# Patient Record
Sex: Female | Born: 1980 | Race: White | Hispanic: No | Marital: Single | Smoking: Current every day smoker
Health system: Southern US, Community
[De-identification: ages and names within clinical notes are randomized; demographics above are authoritative.]

## PROBLEM LIST (undated history)

## (undated) DIAGNOSIS — J45909 Unspecified asthma, uncomplicated: Secondary | ICD-10-CM

## (undated) DIAGNOSIS — T8859XA Other complications of anesthesia, initial encounter: Secondary | ICD-10-CM

## (undated) DIAGNOSIS — T4145XA Adverse effect of unspecified anesthetic, initial encounter: Secondary | ICD-10-CM

## (undated) DIAGNOSIS — M1990 Unspecified osteoarthritis, unspecified site: Secondary | ICD-10-CM

## (undated) HISTORY — PX: MOUTH SURGERY: SHX715

---

## 2002-01-08 ENCOUNTER — Encounter: Payer: Self-pay | Admitting: Obstetrics and Gynecology

## 2002-01-08 ENCOUNTER — Inpatient Hospital Stay (HOSPITAL_COMMUNITY): Admission: AD | Admit: 2002-01-08 | Discharge: 2002-01-08 | Payer: Self-pay | Admitting: Obstetrics and Gynecology

## 2015-06-29 ENCOUNTER — Emergency Department (HOSPITAL_COMMUNITY): Payer: Managed Care, Other (non HMO)

## 2015-06-29 ENCOUNTER — Emergency Department (HOSPITAL_COMMUNITY)
Admission: EM | Admit: 2015-06-29 | Discharge: 2015-06-29 | Disposition: A | Discharge status: Home / Self Care | Payer: Managed Care, Other (non HMO) | Attending: Emergency Medicine | Admitting: Emergency Medicine

## 2015-06-29 ENCOUNTER — Encounter (HOSPITAL_COMMUNITY): Payer: Self-pay | Admitting: Emergency Medicine

## 2015-06-29 DIAGNOSIS — T148XXA Other injury of unspecified body region, initial encounter: Secondary | ICD-10-CM

## 2015-06-29 DIAGNOSIS — W000XXA Fall on same level due to ice and snow, initial encounter: Secondary | ICD-10-CM | POA: Diagnosis not present

## 2015-06-29 DIAGNOSIS — Z880 Allergy status to penicillin: Secondary | ICD-10-CM | POA: Diagnosis not present

## 2015-06-29 DIAGNOSIS — S82891A Other fracture of right lower leg, initial encounter for closed fracture: Secondary | ICD-10-CM

## 2015-06-29 DIAGNOSIS — S82851A Displaced trimalleolar fracture of right lower leg, initial encounter for closed fracture: Secondary | ICD-10-CM | POA: Diagnosis not present

## 2015-06-29 DIAGNOSIS — W009XXA Unspecified fall due to ice and snow, initial encounter: Secondary | ICD-10-CM

## 2015-06-29 DIAGNOSIS — R000 Tachycardia, unspecified: Secondary | ICD-10-CM | POA: Insufficient documentation

## 2015-06-29 DIAGNOSIS — Y9389 Activity, other specified: Secondary | ICD-10-CM | POA: Diagnosis not present

## 2015-06-29 DIAGNOSIS — S99911A Unspecified injury of right ankle, initial encounter: Secondary | ICD-10-CM | POA: Diagnosis present

## 2015-06-29 DIAGNOSIS — Y9289 Other specified places as the place of occurrence of the external cause: Secondary | ICD-10-CM | POA: Insufficient documentation

## 2015-06-29 DIAGNOSIS — F17210 Nicotine dependence, cigarettes, uncomplicated: Secondary | ICD-10-CM | POA: Insufficient documentation

## 2015-06-29 DIAGNOSIS — Y998 Other external cause status: Secondary | ICD-10-CM | POA: Diagnosis not present

## 2015-06-29 MED ORDER — PROPOFOL 10 MG/ML IV BOLUS
INTRAVENOUS | Status: AC | PRN
  Administered 2015-06-29: 32 mg via INTRAVENOUS

## 2015-06-29 MED ORDER — PROPOFOL 10 MG/ML IV BOLUS: 0.5000 mg/kg | INTRAVENOUS | Status: DC | PRN

## 2015-06-29 MED ORDER — SODIUM CHLORIDE 0.9 % IV SOLN
INTRAVENOUS | Status: AC | PRN
Start: 2015-06-29 — End: 2015-06-29
  Administered 2015-06-29: 1000 mL via INTRAVENOUS

## 2015-06-29 MED ORDER — MORPHINE SULFATE (PF) 4 MG/ML IV SOLN
8.0000 mg | Freq: Once | INTRAVENOUS | Status: AC
  Administered 2015-06-29: 8 mg via INTRAVENOUS
  Filled 2015-06-29: qty 2

## 2015-06-29 MED ORDER — HYDROMORPHONE HCL 1 MG/ML IJ SOLN
1.0000 mg | Freq: Once | INTRAMUSCULAR | Status: AC
  Administered 2015-06-29: 1 mg via INTRAVENOUS
  Filled 2015-06-29: qty 1

## 2015-06-29 MED ORDER — PROPOFOL 10 MG/ML IV BOLUS
INTRAVENOUS | Status: AC
  Filled 2015-06-29: qty 20

## 2015-06-29 MED ORDER — OXYCODONE-ACETAMINOPHEN 5-325 MG PO TABS: 1.0000 | ORAL_TABLET | ORAL | Status: AC | PRN

## 2015-06-29 NOTE — Progress Notes (Signed)
Orthopedic Tech Progress Note Patient Details:  Aviva SignsKendra Sisley 10/17/1980 [id number]  Ortho Devices Type of Ortho Device: Post (short leg) splint, Stirrup splint Ortho Device/Splint Location: rle Ortho Device/Splint Interventions: Ordered, Application   [name], Mariano Doshi J 06/29/2015, 9:32 PM

## 2015-06-29 NOTE — Discharge Instructions (Signed)
Cast or Splint Care °Casts and splints support injured limbs and keep bones from moving while they heal. It is important to care for your cast or splint at home.   °HOME CARE INSTRUCTIONS °· Keep the cast or splint uncovered during the drying period. It can take 24 to 48 hours to dry if it is made of plaster. A fiberglass cast will dry in less than 1 hour. °· Do not rest the cast on anything harder than a pillow for the first 24 hours. °· Do not put weight on your injured limb or apply pressure to the cast until your health care provider gives you permission. °· Keep the cast or splint dry. Wet casts or splints can lose their shape and may not support the limb as well. A wet cast that has lost its shape can also create harmful pressure on your skin when it dries. Also, wet skin can become infected. °· Cover the cast or splint with a plastic bag when bathing or when out in the rain or snow. If the cast is on the trunk of the body, take sponge baths until the cast is removed. °· If your cast does become wet, dry it with a towel or a blow dryer on the cool setting only. °· Keep your cast or splint clean. Soiled casts may be wiped with a moistened cloth. °· Do not place any hard or soft foreign objects under your cast or splint, such as cotton, toilet paper, lotion, or powder. °· Do not try to scratch the skin under the cast with any object. The object could get stuck inside the cast. Also, scratching could lead to an infection. If itching is a problem, use a blow dryer on a cool setting to relieve discomfort. °· Do not trim or cut your cast or remove padding from inside of it. °· Exercise all joints next to the injury that are not immobilized by the cast or splint. For example, if you have a long leg cast, exercise the hip joint and toes. If you have an arm cast or splint, exercise the shoulder, elbow, thumb, and fingers. °· Elevate your injured arm or leg on 1 or 2 pillows for the first 1 to 3 days to decrease  swelling and pain. It is best if you can comfortably elevate your cast so it is higher than your heart. °SEEK MEDICAL CARE IF:  °· Your cast or splint cracks. °· Your cast or splint is too tight or too loose. °· You have unbearable itching inside the cast. °· Your cast becomes wet or develops a soft spot or area. °· You have a bad smell coming from inside your cast. °· You get an object stuck under your cast. °· Your skin around the cast becomes red or raw. °· You have new pain or worsening pain after the cast has been applied. °SEEK IMMEDIATE MEDICAL CARE IF:  °· You have fluid leaking through the cast. °· You are unable to move your fingers or toes. °· You have discolored (blue or white), cool, painful, or very swollen fingers or toes beyond the cast. °· You have tingling or numbness around the injured area. °· You have severe pain or pressure under the cast. °· You have any difficulty with your breathing or have shortness of breath. °· You have chest pain. °  °This information is not intended to replace advice given to you by your health care provider. Make sure you discuss any questions you have with your health care   provider.   Document Released: 06/03/2000 Document Revised: 03/27/2013 Document Reviewed: 12/13/2012 Elsevier Interactive Patient Education 2016 Else[employer] Inc[employer] Ankle Fracture A fracture is a break in a bone. The ankle joint is made up of three bones. These include the lower (distal)sections of your lower leg bones, called the tibia and fibula, along with a bone in your foot, called the talus. Depending on how bad the break is and if more than one ankle joint bone is broken, a cast or splint is used to protect and keep your injured bone from moving while it heals. Sometimes, surgery is required to help the fracture heal properly.  There are two general types of fractures:  Stable fracture. This includes a single fracture line through one bone, with no injury to ankle ligaments. A fracture of  the talus that does not have any displacement (movement of the bone on either side of the fracture line) is also stable.  Unstable fracture. This includes more than one fracture line through one or more bones in the ankle joint. It also includes fractures that have displacement of the bone on either side of the fracture line. CAUSES  A direct blow to the ankle.   Quickly and severely twisting your ankle.  Trauma, such as a car accident or falling from a significant height. RISK FACTORS You may be at a higher risk of ankle fracture if:  You have certain medical conditions.  You are involved in high-impact sports.  You are involved in a high-impact car accident. SIGNS AND SYMPTOMS   Tender and swollen ankle.  Bruising around the injured ankle.  Pain on movement of the ankle.  Difficulty walking or putting weight on the ankle.  A cold foot below the site of the ankle injury. This can occur if the blood vessels passing through your injured ankle were also damaged.  Numbness in the foot below the site of the ankle injury. DIAGNOSIS  An ankle fracture is usually diagnosed with a physical exam and X-rays. A CT scan may also be required for complex fractures. TREATMENT  Stable fractures are treated with a cast or splint and using crutches to avoid putting weight on your injured ankle. This is followed by an ankle strengthening program. Some patients require a special type of cast, depending on other medical problems they may have. Unstable fractures require surgery to ensure the bones heal properly. Your health care provider will tell you what type of fracture you have and the best treatment for your condition. HOME CARE INSTRUCTIONS   Review correct crutch use with your health care provider and use your crutches as directed. Safe use of crutches is extremely important. Misuse of crutches can cause you to fall or cause injury to nerves in your hands or armpits.  Do not put weight or  pressure on the injured ankle until directed by your health care provider.  To lessen the swelling, keep the injured leg elevated while sitting or lying down.  Apply ice to the injured area:  Put ice in a plastic bag.  Place a towel between your cast and the bag.  Leave the ice on for 20 minutes, 2-3 times a day.  If you have a plaster or fiberglass cast:  Do not try to scratch the skin under the cast with any objects. This can increase your risk of skin infection.  Check the skin around the cast every day. You may put lotion on any red or sore areas.  Keep your cast dry and  clean.  If you have a plaster splint:  Wear the splint as directed.  You may loosen the elastic around the splint if your toes become numb, tingle, or turn cold or blue.  Do not put pressure on any part of your cast or splint; it may break. Rest your cast only on a pillow the first 24 hours until it is fully hardened.  Your cast or splint can be protected during bathing with a plastic bag sealed to your skin with medical tape. Do not lower the cast or splint into water.  Take medicines as directed by your health care provider. Only take over-the-counter or prescription medicines for pain, discomfort, or fever as directed by your health care provider.  Do not drive a vehicle until your health care provider specifically tells you it is safe to do so.  If your health care provider has given you a follow-up appointment, it is very important to keep that appointment. Not keeping the appointment could result in a chronic or permanent injury, pain, and disability. If you have any problem keeping the appointment, call the facility for assistance. SEEK MEDICAL CARE IF: You develop increased swelling or discomfort. SEEK IMMEDIATE MEDICAL CARE IF:   Your cast gets damaged or breaks.  You have continued severe pain.  You develop new pain or swelling after the cast was put on.  Your skin or toenails below the  injury turn blue or gray.  Your skin or toenails below the injury feel cold, numb, or have loss of sensitivity to touch.  There is a bad smell or pus draining from under the cast. MAKE SURE YOU:   Understand these instructions.  Will watch your condition.  Will get help right away if you are not doing well or get worse.   This information is not intended to replace advice given to you by your health care provider. Make sure you discuss any questions you have with your health care provider.   Document Released: 06/03/2000 Document Revised: 06/11/2013 Document Reviewed: 01/03/2013 Elsevier Interactive Patient Education 2016 Elsevier Inc. Cryotherapy Cryotherapy means treatment with cold. Ice or gel packs can be used to reduce both pain and swelling. Ice is the most helpful within the first 24 to 48 hours after an injury or flare-up from overusing a muscle or joint. Sprains, strains, spasms, burning pain, shooting pain, and aches can all be eased with ice. Ice can also be used when recovering from surgery. Ice is effective, has very few side effects, and is safe for most people to use. PRECAUTIONS  Ice is not a safe treatment option for people with:  Raynaud phenomenon. This is a condition affecting small blood vessels in the extremities. Exposure to cold may cause your problems to return.  Cold hypersensitivity. There are many forms of cold hypersensitivity, including:  Cold urticaria. Red, itchy hives appear on the skin when the tissues begin to warm after being iced.  Cold erythema. This is a red, itchy rash caused by exposure to cold.  Cold hemoglobinuria. Red blood cells break down when the tissues begin to warm after being iced. The hemoglobin that carry oxygen are passed into the urine because they cannot combine with blood proteins fast enough.  Numbness or altered sensitivity in the area being iced. If you have any of the following conditions, do not use ice until you have  discussed cryotherapy with your caregiver:  Heart conditions, such as arrhythmia, angina, or chronic heart disease.  High blood pressure.  Healing  wounds or open skin in the area being iced.  Current infections.  Rheumatoid arthritis.  Poor circulation.  Diabetes. Ice slows the blood flow in the region it is applied. This is beneficial when trying to stop inflamed tissues from spreading irritating chemicals to surrounding tissues. However, if you expose your skin to cold temperatures for too long or without the proper protection, you can damage your skin or nerves. Watch for signs of skin damage due to cold. HOME CARE INSTRUCTIONS Follow these tips to use ice and cold packs safely.  Place a dry or damp towel between the ice and skin. A damp towel will cool the skin more quickly, so you may need to shorten the time that the ice is used.  For a more rapid response, add gentle compression to the ice.  Ice for no more than 10 to 20 minutes at a time. The bonier the area you are icing, the less time it will take to get the benefits of ice.  Check your skin after 5 minutes to make sure there are no signs of a poor response to cold or skin damage.  Rest 20 minutes or more between uses.  Once your skin is numb, you can end your treatment. You can test numbness by very lightly touching your skin. The touch should be so light that you do not see the skin dimple from the pressure of your fingertip. When using ice, most people will feel these normal sensations in this order: cold, burning, aching, and numbness.  Do not use ice on someone who cannot communicate their responses to pain, such as small children or people with dementia. HOW TO MAKE AN ICE PACK Ice packs are the most common way to use ice therapy. Other methods include ice massage, ice baths, and cryosprays. Muscle creams that cause a cold, tingly feeling do not offer the same benefits that ice offers and should not be used as a  substitute unless recommended by your caregiver. To make an ice pack, do one of the following:  Place crushed ice or a bag of frozen vegetables in a sealable plastic bag. Squeeze out the excess air. Place this bag inside another plastic bag. Slide the bag into a pillowcase or place a damp towel between your skin and the bag.  Mix 3 parts water with 1 part rubbing alcohol. Freeze the mixture in a sealable plastic bag. When you remove the mixture from the freezer, it will be slushy. Squeeze out the excess air. Place this bag inside another plastic bag. Slide the bag into a pillowcase or place a damp towel between your skin and the bag. SEEK MEDICAL CARE IF:  You develop white spots on your skin. This may give the skin a blotchy (mottled) appearance.  Your skin turns blue or pale.  Your skin becomes waxy or hard.  Your swelling gets worse. MAKE SURE YOU:   Understand these instructions.  Will watch your condition.  Will get help right away if you are not doing well or get worse.   This information is not intended to replace advice given to you by your health care provider. Make sure you discuss any questions you have with your health care provider.   Document Released: 01/31/2011 Document Revised: 06/27/2014 Document Reviewed: 01/31/2011 Elsevier Interactive Patient Education [employer]2016 Elsevier Inc.

## 2015-06-29 NOTE — ED Provider Notes (Signed)
Patient signed out of end of shift by [facility], PA-C, with ankle fracture dislocation of right ankle, requiring reduction. Reduction performed under conscious sedation supervised by Dr. Linwood DibblesJon Knapp. Ankle reduced by countertraction and realignment with good post-reduction position of mortise and fractured fibular/tibia. Distal pulses remain intact. Good skin color without ischemic changes. Ankle splinted. She will be referred to Dr. Lajoyce Cornersduda in the outpatient setting. Care instructions provided, pain management also provided.   Elpidio AnisShari Nilo Fallin, PA-C 06/29/15 2203

## 2015-06-29 NOTE — ED Provider Notes (Signed)
CSN: [id number]     Arrival date & time 06/29/15  1848 History   First MD Initiated Contact with Patient 06/29/15 1912     Chief Complaint  Patient presents with  . Fall  . Ankle Injury     (Consider location/radiation/quality/duration/timing/severity/associated sxs/prior Treatment) HPI Comments: Jacqueline Soto is a 35 y.o. female who presents to the ED with complaints of mechanical fall after she slipped on the ice proximally 2 hours prior to arrival, and subsequently falling on her right ankle. Patient describes the pain is 10/10 crampy nonradiating right ankle pain worse with movement and improved somewhat with morphine 8 mg given in route, but she states that this seems to have worn off at this point. Associated symptoms include tingling in the foot as well as swelling in the ankle. Per EMS report, +deformity of R ankle. She denies any numbness, loss of range of motion in the toes, back or neck pain, head injury or loss of consciousness, abrasions, bruising, or other injuries. She denies any chest pain, shortness breath, abdominal pain, nausea, or vomiting.   Patient is a 35 y.o. female presenting with fall and lower extremity injury. The history is provided by the patient. No language interpreter was used.  Fall This is a new problem. The current episode started today. The problem occurs rarely. The problem has been unchanged. Associated symptoms include arthralgias (R ankle) and joint swelling (R ankle). Pertinent negatives include no abdominal pain, chest pain, myalgias, nausea, neck pain, numbness, vomiting or weakness.  Ankle Injury This is a new problem. The current episode started today. The problem occurs constantly. The problem has been unchanged. Associated symptoms include arthralgias (R ankle) and joint swelling (R ankle). Pertinent negatives include no abdominal pain, chest pain, myalgias, nausea, neck pain, numbness, vomiting or weakness. Exacerbated by: movement. Treatments tried:  8mg  morphine. The treatment provided moderate relief.    History reviewed. No pertinent past medical history. Past Surgical History  Procedure Laterality Date  . Mouth surgery     History reviewed. No pertinent family history. Social History  Substance Use Topics  . Smoking status: Current Every Day Smoker -- 0.50 packs/day    Types: Cigarettes  . Smokeless tobacco: None  . Alcohol Use: Yes     Comment: occasionally   OB History    No data available     Review of Systems  HENT: Negative for facial swelling (no head inj).   Respiratory: Negative for shortness of breath.   Cardiovascular: Negative for chest pain.  Gastrointestinal: Negative for nausea, vomiting and abdominal pain.  Musculoskeletal: Positive for joint swelling (R ankle) and arthralgias (R ankle). Negative for myalgias, back pain and neck pain.  Skin: Negative for color change and wound.  Allergic/Immunologic: Negative for immunocompromised state.  Neurological: Negative for syncope, weakness and numbness.       +tingling in R foot  Psychiatric/Behavioral: Negative for confusion.   10 Systems reviewed and are negative for acute change except as noted in the HPI.    Allergies  Penicillins  Home Medications   Prior to Admission medications   Not on File   BP 127/86 mmHg  Pulse 103  Temp(Src) 99 F (37.2 C) (Oral)  Resp 15  Ht 5\' 2"  (1.575 m)  Wt 62.596 kg  BMI 25.23 kg/m2  SpO2 98%  LMP 06/16/2015 Physical Exam  Constitutional: She is oriented to person, place, and time. Vital signs are normal. She appears well-developed and well-nourished.  Non-toxic appearance. No distress.  Afebrile,  nontoxic, NAD  HENT:  Head: Normocephalic and atraumatic.  Mouth/Throat: Oropharynx is clear and moist and mucous membranes are normal.  [place]/AT  Eyes: Conjunctivae and EOM are normal. Right eye exhibits no discharge. Left eye exhibits no discharge.  Neck: Normal range of motion. Neck supple. No spinous process  tenderness and no muscular tenderness present. No rigidity. Normal range of motion present.  FROM intact without spinous process TTP, no bony stepoffs or deformities, no paraspinous muscle TTP or muscle spasms. No rigidity or meningeal signs. No bruising or swelling.   Cardiovascular: Regular rhythm, normal heart sounds and intact distal pulses.  Tachycardia present.  Exam reveals no gallop and no friction rub.   No murmur heard. Mildly tachycardic especially when ankle is manipulated, likely related to pain  Pulmonary/Chest: Effort normal and breath sounds normal. No respiratory distress. She has no decreased breath sounds. She has no wheezes. She has no rhonchi. She has no rales.  Abdominal: Soft. Normal appearance and bowel sounds are normal. She exhibits no distension. There is no tenderness. There is no rigidity, no rebound and no guarding.  Musculoskeletal:       Right ankle: She exhibits decreased range of motion, swelling and deformity. She exhibits no laceration and normal pulse. Tenderness. Lateral malleolus and medial malleolus tenderness found.  R ankle with obvious deformity, foot lateral rotated with respect to lower leg, ROM limited due to pain, +swelling, with +TTP diffusely over entire ankle but no tenderness in forefoot or calf. No break in skin. No bruising or erythema. No warmth. Achilles unable to be assessed due to pain with ankle movement but no defect noted. Good pedal pulse and cap refill of all toes. Wiggling toes without difficulty. Sensation grossly intact.  No tib/fib, knee, or thigh/hip tenderness on palpation. No other deformities noted in all other extremities  Neurological: She is alert and oriented to person, place, and time. She has normal strength. No sensory deficit.  Skin: Skin is warm, dry and intact. No abrasion, no bruising and no rash noted.  No bruising or abrasions noted  Psychiatric: She has a normal mood and affect.  Nursing note and vitals  reviewed.   ED Course  Procedures (including critical care time) Labs Review Labs Reviewed - No data to display  Imaging Review Dg Ankle 2 Views Right  06/29/2015  CLINICAL DATA:  Fall today with right lower leg deformity) EXAM: RIGHT ANKLE - 2 VIEW COMPARISON:  None. FINDINGS: There is an acute moderately displaced trimalleolar fracture of the right ankle. The ankle mortise is widened with posterolateral displacement of the talus. No tarsal bone fracture identified. IMPRESSION: Moderately displaced trimalleolar right ankle fracture dislocation. Electronically Signed   By: Carey Bullocks M.D.   On: 06/29/2015 19:51   I have personally reviewed and evaluated these images and lab results as part of my medical decision-making.   EKG Interpretation None      MDM   Final diagnoses:  Fracture dislocation of ankle, right, closed, initial encounter    35 y.o. female here with R ankle pain after slipping and falling on ice. +deformity. NVI with soft compartments, tenderness to ankle but no other focal bony tenderness. +swelling. Distal pulses strong in foot, good cap refill. Likely fracture/dislocation. Will give pain meds and obtain xray. Will likely need dislocation reduction but will await xray imaging first. Will reassess shortly.   7:55 PM Xray showing displaced fracture dislocation of ankle. Care signed out to Dr. Lynelle Doctor and Elpidio Anis PA-C at shift  change, who will resume management and subsequent ortho consult. Please see their notes for further documentation of care.   BP 127/86 mmHg  Pulse 103  Temp(Src) 99 F (37.2 C) (Oral)  Resp 15  Ht 5\' 2"  (1.575 m)  Wt 62.596 kg  BMI 25.23 kg/m2  SpO2 98%  LMP 06/16/2015  Meds ordered this encounter  Medications  . morphine 4 MG/ML injection 8 mg    Sig:      Shekia Kuper Camprubi-Soms, PA-C 06/29/15 1956  Linwood DibblesJon Knapp, MD 06/29/15 2019

## 2015-06-29 NOTE — ED Provider Notes (Signed)
Pt injured her right ankle slipping on the ice shortly prior to arrival.  Gross deformity of the right ankle noted.  Last PO status No history of significant medical problems.  NO heart or lung disease. Physical Exam  BP 112/95 mmHg  Pulse 110  Temp(Src) 99 F (37.2 C) (Oral)  Resp 15  Ht 5\' 2"  (1.575 m)  Wt 62.596 kg  BMI 25.23 kg/m2  SpO2 99%  LMP 06/16/2015  Physical Exam  Constitutional: She appears well-developed and well-nourished. No distress.  HENT:  Head: Normocephalic and atraumatic.  Right Ear: External ear normal.  Left Ear: External ear normal.  Mouth/Throat: No oropharyngeal exudate.  Eyes: Conjunctivae are normal. Right eye exhibits no discharge. Left eye exhibits no discharge. No scleral icterus.  Neck: Neck supple. No tracheal deviation present.  Cardiovascular: Normal rate.   Pulmonary/Chest: Effort normal and breath sounds normal. No stridor. No respiratory distress.  Musculoskeletal: She exhibits no edema.  Neurological: She is alert. Cranial nerve deficit: no gross deficits.  Skin: Skin is warm and dry. No rash noted.  Psychiatric: She has a normal mood and affect.  Nursing note and vitals reviewed.   ED Course  .Sedation Date/Time: 06/29/2015 8:21 PM Performed by: Linwood Dibbles Authorized by: Linwood Dibbles  Consent:    Consent obtained:  Verbal   Consent given by:  Patient   Risks discussed:  Inadequate sedation, nausea, prolonged hypoxia resulting in organ damage, prolonged sedation necessitating reversal, respiratory compromise necessitating ventilatory assistance and intubation and vomiting   Alternatives discussed:  Analgesia without sedation Indications:    Sedation purpose:  Dislocation reduction   Procedure necessitating sedation performed by:  Physician performing sedation   Intended level of sedation:  Deep Pre-sedation assessment:    NPO status caution: urgency dictates proceeding with non-ideal NPO status     ASA classification: class 1 -  normal, healthy patient     Neck mobility: normal     Mouth opening:  3 or more finger widths   Mallampati score:  II - soft palate, uvula, fauces visible   Pre-sedation assessments completed and reviewed: airway patency, cardiovascular function, hydration status, mental status, nausea/vomiting, pain level and respiratory function     Pre-sedation assessment completed:  06/29/2015 8:22 PM Immediate pre-procedure details:    Reassessment: Patient reassessed immediately prior to procedure     Reviewed: vital signs, relevant labs/tests and NPO status   Procedure details (see MAR for exact dosages):    Sedation start time:  06/29/2015 8:30 PM   Preoxygenation:  Nasal cannula   Sedation:  Propofol   Intra-procedure monitoring:  Blood pressure monitoring, cardiac monitor, continuous pulse oximetry, continuous capnometry, frequent LOC assessments and frequent vital sign checks   Intra-procedure events: none     Intra-procedure management:  Supplemental oxygen   Sedation end time:  06/29/2015 8:45 PM Post-procedure details:    Post-sedation assessment completed:  06/29/2015 8:57 PM   Attendance: Constant attendance by certified staff until patient recovered     Recovery: Patient returned to pre-procedure baseline     Post-sedation assessments completed and reviewed: airway patency, cardiovascular function, mental status, nausea/vomiting and pain level     Patient is stable for discharge or admission: Yes     Patient tolerance:  Tolerated well, no immediate complications Comments:     Elpidio Anis performed the procedure.  I assisted at the bedside.   MDM Pt tolerated procedure well.  Plan on splint, post procedure xray and ortho referral.  Linwood DibblesJon Lathen Seal, MD 06/29/15 [phone]

## 2015-06-29 NOTE — ED Notes (Signed)
Pt slipped on ice and fell. Obvious deformity to right ankle, pulses intact. Pt received 8mg  morphine PTA. No other complaints.

## 2015-06-29 NOTE — ED Notes (Signed)
PT able to ambulate in room with crutches. PT able to demonstrate effective non weight bearing technique. PT has used crutches in the past

## 2015-06-30 ENCOUNTER — Other Ambulatory Visit: Payer: Self-pay | Admitting: Orthopedic Surgery

## 2015-06-30 ENCOUNTER — Other Ambulatory Visit (HOSPITAL_COMMUNITY): Payer: Self-pay | Admitting: Orthopedic Surgery

## 2015-06-30 DIAGNOSIS — S82871A Displaced pilon fracture of right tibia, initial encounter for closed fracture: Secondary | ICD-10-CM

## 2015-06-30 DIAGNOSIS — M25571 Pain in right ankle and joints of right foot: Secondary | ICD-10-CM

## 2015-07-02 ENCOUNTER — Encounter (HOSPITAL_COMMUNITY): Payer: Self-pay | Admitting: *Deleted

## 2015-07-02 ENCOUNTER — Ambulatory Visit
Admission: RE | Admit: 2015-07-02 | Discharge: 2015-07-02 | Disposition: A | Discharge status: Home / Self Care | Payer: Managed Care, Other (non HMO) | Source: Ambulatory Visit | Attending: Orthopedic Surgery | Admitting: Orthopedic Surgery

## 2015-07-02 DIAGNOSIS — M25571 Pain in right ankle and joints of right foot: Secondary | ICD-10-CM

## 2015-07-02 DIAGNOSIS — S82871A Displaced pilon fracture of right tibia, initial encounter for closed fracture: Secondary | ICD-10-CM

## 2015-07-02 MED ORDER — CHLORHEXIDINE GLUCONATE 4 % EX LIQD: 60.0000 mL | Freq: Once | CUTANEOUS | Status: DC

## 2015-07-02 MED ORDER — CLINDAMYCIN PHOSPHATE 900 MG/50ML IV SOLN
900.0000 mg | INTRAVENOUS | Status: AC
  Administered 2015-07-03: 900 mg via INTRAVENOUS
  Filled 2015-07-02: qty 50

## 2015-07-03 ENCOUNTER — Ambulatory Visit (HOSPITAL_COMMUNITY)
Admission: RE | Admit: 2015-07-03 | Discharge: 2015-07-03 | Disposition: A | Discharge status: Home / Self Care | Payer: Managed Care, Other (non HMO) | Source: Ambulatory Visit | Attending: Orthopedic Surgery | Admitting: Orthopedic Surgery

## 2015-07-03 ENCOUNTER — Ambulatory Visit (HOSPITAL_COMMUNITY): Payer: Managed Care, Other (non HMO) | Admitting: Certified Registered Nurse Anesthetist

## 2015-07-03 ENCOUNTER — Encounter (HOSPITAL_COMMUNITY): Payer: Self-pay | Admitting: *Deleted

## 2015-07-03 ENCOUNTER — Encounter (HOSPITAL_COMMUNITY)
Admission: RE | Disposition: A | Discharge status: Home / Self Care | Payer: Self-pay | Source: Ambulatory Visit | Attending: Orthopedic Surgery

## 2015-07-03 DIAGNOSIS — S82831A Other fracture of upper and lower end of right fibula, initial encounter for closed fracture: Secondary | ICD-10-CM | POA: Insufficient documentation

## 2015-07-03 DIAGNOSIS — S82871A Displaced pilon fracture of right tibia, initial encounter for closed fracture: Secondary | ICD-10-CM | POA: Diagnosis present

## 2015-07-03 DIAGNOSIS — F17210 Nicotine dependence, cigarettes, uncomplicated: Secondary | ICD-10-CM | POA: Diagnosis not present

## 2015-07-03 DIAGNOSIS — Z880 Allergy status to penicillin: Secondary | ICD-10-CM | POA: Insufficient documentation

## 2015-07-03 DIAGNOSIS — W000XXA Fall on same level due to ice and snow, initial encounter: Secondary | ICD-10-CM | POA: Diagnosis not present

## 2015-07-03 HISTORY — DX: Unspecified asthma, uncomplicated: J45.909

## 2015-07-03 HISTORY — DX: Unspecified osteoarthritis, unspecified site: M19.90

## 2015-07-03 HISTORY — DX: Other complications of anesthesia, initial encounter: T88.59XA

## 2015-07-03 HISTORY — DX: Adverse effect of unspecified anesthetic, initial encounter: T41.45XA

## 2015-07-03 HISTORY — PX: ORIF ANKLE FRACTURE: SHX5408

## 2015-07-03 LAB — CBC
HCT: 35.9 % — ABNORMAL LOW (ref 36.0–46.0)
Hemoglobin: 11.8 g/dL — ABNORMAL LOW (ref 12.0–15.0)
MCH: 29.6 pg (ref 26.0–34.0)
MCHC: 32.9 g/dL (ref 30.0–36.0)
MCV: 90 fL (ref 78.0–100.0)
PLATELETS: 278 10*3/uL (ref 150–400)
RBC: 3.99 MIL/uL (ref 3.87–5.11)
RDW: 12.4 % (ref 11.5–15.5)
WBC: 12.9 10*3/uL — AB (ref 4.0–10.5)

## 2015-07-03 LAB — SURGICAL PCR SCREEN
MRSA, PCR: NEGATIVE
STAPHYLOCOCCUS AUREUS: POSITIVE — AB

## 2015-07-03 LAB — HCG, SERUM, QUALITATIVE: Preg, Serum: NEGATIVE

## 2015-07-03 SURGERY — OPEN REDUCTION INTERNAL FIXATION (ORIF) ANKLE FRACTURE
Anesthesia: Regional | Laterality: Right

## 2015-07-03 MED ORDER — LACTATED RINGERS IV SOLN
INTRAVENOUS | Status: DC
  Administered 2015-07-03 (×2): via INTRAVENOUS

## 2015-07-03 MED ORDER — MIDAZOLAM HCL 2 MG/2ML IJ SOLN
INTRAMUSCULAR | Status: AC
  Administered 2015-07-03: 2 mg
  Filled 2015-07-03: qty 2

## 2015-07-03 MED ORDER — ASPIRIN EC 81 MG PO TBEC: 81.0000 mg | DELAYED_RELEASE_TABLET | Freq: Every day | ORAL | Status: AC

## 2015-07-03 MED ORDER — FENTANYL CITRATE (PF) 100 MCG/2ML IJ SOLN
INTRAMUSCULAR | Status: DC | PRN
  Administered 2015-07-03: 50 ug via INTRAVENOUS

## 2015-07-03 MED ORDER — FENTANYL CITRATE (PF) 250 MCG/5ML IJ SOLN
INTRAMUSCULAR | Status: AC
  Filled 2015-07-03: qty 5

## 2015-07-03 MED ORDER — LIDOCAINE HCL (CARDIAC) 20 MG/ML IV SOLN
INTRAVENOUS | Status: DC | PRN
  Administered 2015-07-03: 40 mg via INTRAVENOUS

## 2015-07-03 MED ORDER — FENTANYL CITRATE (PF) 100 MCG/2ML IJ SOLN
100.0000 ug | Freq: Once | INTRAMUSCULAR | Status: AC
Start: 2015-07-03 — End: 2015-07-03
  Administered 2015-07-03: 100 ug via INTRAVENOUS
  Filled 2015-07-03: qty 2

## 2015-07-03 MED ORDER — MIDAZOLAM HCL 2 MG/2ML IJ SOLN
2.0000 mg | Freq: Once | INTRAMUSCULAR | Status: DC
  Filled 2015-07-03: qty 2

## 2015-07-03 MED ORDER — BUPIVACAINE HCL (PF) 0.5 % IJ SOLN
INTRAMUSCULAR | Status: DC | PRN
  Administered 2015-07-03: 10 mL

## 2015-07-03 MED ORDER — HYDROMORPHONE HCL 1 MG/ML IJ SOLN: 0.2500 mg | INTRAMUSCULAR | Status: DC | PRN

## 2015-07-03 MED ORDER — SODIUM CHLORIDE 0.9 % IR SOLN
Status: DC | PRN
  Administered 2015-07-03: 1000 mL

## 2015-07-03 MED ORDER — FENTANYL CITRATE (PF) 100 MCG/2ML IJ SOLN
INTRAMUSCULAR | Status: AC
  Filled 2015-07-03: qty 2

## 2015-07-03 MED ORDER — OXYCODONE-ACETAMINOPHEN 5-325 MG PO TABS: 1.0000 | ORAL_TABLET | ORAL | Status: AC | PRN

## 2015-07-03 MED ORDER — PHENYLEPHRINE HCL 10 MG/ML IJ SOLN
INTRAMUSCULAR | Status: DC | PRN
  Administered 2015-07-03: 120 ug via INTRAVENOUS

## 2015-07-03 MED ORDER — BUPIVACAINE-EPINEPHRINE (PF) 0.5% -1:200000 IJ SOLN
INTRAMUSCULAR | Status: DC | PRN
  Administered 2015-07-03: 30 mL via PERINEURAL

## 2015-07-03 MED ORDER — PROPOFOL 10 MG/ML IV BOLUS
INTRAVENOUS | Status: DC | PRN
  Administered 2015-07-03: 120 mg via INTRAVENOUS

## 2015-07-03 MED ORDER — ONDANSETRON HCL 4 MG/2ML IJ SOLN
INTRAMUSCULAR | Status: DC | PRN
  Administered 2015-07-03: 4 mg via INTRAVENOUS

## 2015-07-03 SURGICAL SUPPLY — 56 items
BANDAGE ESMARK 6X9 LF (GAUZE/BANDAGES/DRESSINGS) IMPLANT
BIT DRILL 2.5X110 QC LCP DISP (BIT) ×2 IMPLANT
BIT DRILL LCP QC 2X140 (BIT) ×2 IMPLANT
BNDG CMPR 9X6 STRL LF SNTH (GAUZE/BANDAGES/DRESSINGS)
BNDG COHESIVE 4X5 TAN STRL (GAUZE/BANDAGES/DRESSINGS) ×3 IMPLANT
BNDG ESMARK 6X9 LF (GAUZE/BANDAGES/DRESSINGS)
BNDG GAUZE ELAST 4 BULKY (GAUZE/BANDAGES/DRESSINGS) ×3 IMPLANT
COVER SURGICAL LIGHT HANDLE (MISCELLANEOUS) ×4 IMPLANT
CUFF TOURNIQUET SINGLE 34IN LL (TOURNIQUET CUFF) IMPLANT
CUFF TOURNIQUET SINGLE 44IN (TOURNIQUET CUFF) IMPLANT
DRAPE INCISE IOBAN 66X45 STRL (DRAPES) ×3 IMPLANT
DRAPE OEC MINIVIEW 54X84 (DRAPES) IMPLANT
DRAPE PROXIMA HALF (DRAPES) ×3 IMPLANT
DRAPE U-SHAPE 47X51 STRL (DRAPES) ×3 IMPLANT
DRESSING ADAPTIC 1/2  N-ADH (PACKING) ×2 IMPLANT
DRSG ADAPTIC 3X8 NADH LF (GAUZE/BANDAGES/DRESSINGS) ×3 IMPLANT
DRSG PAD ABDOMINAL 8X10 ST (GAUZE/BANDAGES/DRESSINGS) ×3 IMPLANT
DURAPREP 26ML APPLICATOR (WOUND CARE) ×3 IMPLANT
ELECT REM PT RETURN 9FT ADLT (ELECTROSURGICAL) ×3
ELECTRODE REM PT RTRN 9FT ADLT (ELECTROSURGICAL) ×1 IMPLANT
GAUZE SPONGE 4X4 12PLY STRL (GAUZE/BANDAGES/DRESSINGS) ×3 IMPLANT
GLOVE BIOGEL PI IND STRL 9 (GLOVE) ×1 IMPLANT
GLOVE BIOGEL PI INDICATOR 9 (GLOVE) ×2
GLOVE SURG ORTHO 9.0 STRL STRW (GLOVE) ×3 IMPLANT
GOWN STRL REUS W/ TWL XL LVL3 (GOWN DISPOSABLE) ×3 IMPLANT
GOWN STRL REUS W/TWL XL LVL3 (GOWN DISPOSABLE) ×9
KIT BASIN OR (CUSTOM PROCEDURE TRAY) ×3 IMPLANT
KIT ROOM TURNOVER OR (KITS) ×3 IMPLANT
MANIFOLD NEPTUNE II (INSTRUMENTS) ×3 IMPLANT
NS IRRIG 1000ML POUR BTL (IV SOLUTION) ×3 IMPLANT
PACK ORTHO EXTREMITY (CUSTOM PROCEDURE TRAY) ×3 IMPLANT
PAD ARMBOARD 7.5X6 YLW CONV (MISCELLANEOUS) ×6 IMPLANT
PLATE LCP 3.5 1/3 TUB 7HX81 (Plate) ×2 IMPLANT
PLATE MED DIST TIBIA 2.7/3.5 (Plate) ×2 IMPLANT
SCREW CORTEX 3.5 12MM (Screw) ×4 IMPLANT
SCREW CORTEX 3.5 14MM (Screw) ×2 IMPLANT
SCREW CORTEX 3.5 22MM (Screw) ×2 IMPLANT
SCREW CORTEX LOW PRO 3.5X26 (Screw) ×2 IMPLANT
SCREW CORTEX LOW PRO 3.5X28 (Screw) ×2 IMPLANT
SCREW LOCK CORT ST 3.5X12 (Screw) IMPLANT
SCREW LOCK CORT ST 3.5X14 (Screw) IMPLANT
SCREW LOCK CORT ST 3.5X22 (Screw) IMPLANT
SCREW LOCK T15 FT 14X3.5X2.9X (Screw) IMPLANT
SCREW LOCKING 3.5X14 (Screw) ×3 IMPLANT
SCREW LOCKING VA 2.7X30MM (Screw) ×2 IMPLANT
SCREW LOCKING VA 2.7X40MM (Screw) ×2 IMPLANT
SPONGE GAUZE 4X4 12PLY STER LF (GAUZE/BANDAGES/DRESSINGS) ×2 IMPLANT
SPONGE LAP 18X18 X RAY DECT (DISPOSABLE) ×3 IMPLANT
STAPLER VISISTAT 35W (STAPLE) IMPLANT
SUCTION FRAZIER TIP 10 FR DISP (SUCTIONS) ×3 IMPLANT
SUT ETHILON 2 0 PSLX (SUTURE) IMPLANT
SUT VIC AB 2-0 CTB1 (SUTURE) ×6 IMPLANT
TOWEL OR 17X24 6PK STRL BLUE (TOWEL DISPOSABLE) ×3 IMPLANT
TOWEL OR 17X26 10 PK STRL BLUE (TOWEL DISPOSABLE) ×3 IMPLANT
TUBE CONNECTING 12'X1/4 (SUCTIONS) ×1
TUBE CONNECTING 12X1/4 (SUCTIONS) ×2 IMPLANT

## 2015-07-03 NOTE — Transfer of Care (Signed)
Immediate Anesthesia Transfer of Care Note  Patient: Aviva SignsKendra Mascari  Procedure(s) Performed: Procedure(s): Open Reduction Internal Fixation Right Ankle (Right)  Patient Location: PACU  Anesthesia Type:GA combined with regional for post-op pain  Level of Consciousness: awake, alert , oriented and patient cooperative  Airway & Oxygen Therapy: Patient Spontanous Breathing and Patient connected to nasal cannula oxygen  Post-op Assessment: Report given to RN and Post -op Vital signs reviewed and stable  Post vital signs: Reviewed and stable  Last Vitals:  Filed Vitals:   07/03/15 1349 07/03/15 1350  BP: 97/46   Pulse: 79 88  Temp:    Resp: 14 16    Complications: No apparent anesthesia complications

## 2015-07-03 NOTE — Anesthesia Procedure Notes (Addendum)
Anesthesia Regional Block:  Popliteal block  Pre-Anesthetic Checklist: ,, timeout performed, Correct Patient, Correct Site, Correct Laterality, Correct Procedure, Correct Position, site marked, Risks and benefits discussed, pre-op evaluation, post-op pain management  Laterality: Right  Prep: Maximum Sterile Barrier Precautions used and chloraprep       Needles:  Injection technique: Single-shot  Needle Type: Echogenic Stimulator Needle     Needle Length: 9cm 9 cm Needle Gauge: 21 and 21 G    Additional Needles:  Procedures: ultrasound guided (picture in chart) and nerve stimulator Popliteal block  Nerve Stimulator or Paresthesia:  Response: Peroneal,  Response: Tibial,   Additional Responses:   Narrative:  Start time: 07/03/2015 1:12 PM End time: 07/03/2015 1:22 PM Injection made incrementally with aspirations every 5 mL. Anesthesiologist: Gaynelle AduFITZGERALD, WILLIAM  Additional Notes: 2% Lidocaine skin wheel. Saphenous block with 10cc of 0.5% Bupivicaine plain.   Procedure Name: LMA Insertion Date/Time: 07/03/2015 3:06 PM Performed by: Faustino CongressWHITE, Madelena Maturin TENA Karel Turpen Pre-anesthesia Checklist: Patient identified, Emergency Drugs available, Suction available and Patient being monitored Patient Re-evaluated:Patient Re-evaluated prior to inductionOxygen Delivery Method: Circle system utilized Preoxygenation: Pre-oxygenation with 100% oxygen Intubation Type: IV induction Ventilation: Mask ventilation without difficulty LMA: LMA inserted LMA Size: 3.0 Number of attempts: 1 Placement Confirmation: positive ETCO2 and breath sounds checked- equal and bilateral Tube secured with: Tape Dental Injury: Teeth and Oropharynx as per pre-operative assessment

## 2015-07-03 NOTE — Progress Notes (Signed)
Orthopedic Tech Progress Note Patient Details:  Jacqueline SignsKendra Soto 04/26/1981 [id number]  Ortho Devices Type of Ortho Device: CAM walker Ortho Device/Splint Location: rle Ortho Device/Splint Interventions: Ordered, Application   [name], Burkley Dech J 07/03/2015, 5:25 PM

## 2015-07-03 NOTE — H&P (Signed)
Jacqueline Soto is an 35 y.o. female.   Chief Complaint: Unstable right ankle fracture HPI: Patient is a [age] year old woman who sustained a fall on the ice sustaining a pilon fracture with associated fibular fracture on the right  Past Medical History  Diagnosis Date  . Asthma     as a child  . Arthritis   . Complication of anesthesia     Not sure what happened, years ago as a child, "throat closed", had to be awaken    Past Surgical History  Procedure Laterality Date  . Mouth surgery      History reviewed. No pertinent family history. Social History:  reports that she has been smoking Cigarettes.  She has a 1.3 pack-year smoking history. She does not have any smokeless tobacco history on file. She reports that she drinks alcohol. She reports that she does not use illicit drugs.  Allergies:  Allergies  Allergen Reactions  . Penicillins Swelling    .Has patient had a PCN reaction causing immediate rash, facial/tongue/throat swelling, SOB or lightheadedness with hypotension:YES Has patient had a PCN reaction causing severe rash involving mucus membranes or skin necrosis: NO Has patient had a PCN reaction that required hospitalizationNO Has patient had a PCN reaction occurring within the last 10 years: NO If all of the above answers are "NO", then may proceed with Cephalosporin use.    No prescriptions prior to admission    No results found for this or any previous visit (from the past 48 hour(s)). Ct Ankle Right Wo Contrast  07/02/2015  CLINICAL DATA:  Evaluate right ankle fracture dislocation. EXAM: CT OF THE RIGHT ANKLE WITHOUT CONTRAST TECHNIQUE: Multidetector CT imaging of the right ankle was performed according to the standard protocol. Multiplanar CT image reconstructions were also generated. COMPARISON:  Radiographs 06/29/2015 FINDINGS: Comminuted longitudinal/vertical fracture involving the posterior and medial aspect of distal tibia. There is a maximum of 3 mm of offset along  the posterior articular surface of the tibia medially. Maximum separation of the posterior malleolar fracture is 2.5 Mm. The fracture continues through the medial malleolus which is split vertically in half. Long oblique coursing distal fibular shaft fracture at and above the level of the ankle mortise. The ankle mortise is maintained. The talus is intact and the subtalar joints are maintained. IMPRESSION: 1. Comminuted intra-articular fracture of the distal tibia as described above. Minimal/mild displacement. Please see 3D images. 2. Long oblique fracture of the distal fibular shaft without significant displacement. 3. The ankle mortise is grossly maintained and no talar or subtalar fractures are identified. Electronically Signed   By: Rudie MeyerP.  Gallerani M.D.   On: 07/02/2015 12:20    Review of Systems  All other systems reviewed and are negative.   Last menstrual period 06/16/2015. Physical Exam  On examination patient has a palpable pulse. Radiographs shows a intra-articular fracture of the tibial talar joint as well as a fracture through the fibula. CT scan shows a pilon fracture of the distal tibia involving the articular surface as well as a displaced fibular fracture. Assessment/Plan Assessment: Right fibular fracture with intra-articular pilon fracture of the tibia.  Plan: We'll plan for open reduction internal fixation. Risks and benefits were discussed including infection neurovascular injury arthritis nonhealing of the wounds need for additional surgery. Patient states she understands wishes to proceed at this time.  Jacqueline Soto 07/03/2015, 6:21 AM

## 2015-07-03 NOTE — Progress Notes (Signed)
Pt mother called out that pt was going to pass out. States her ears felt clogged and and she was seeing stars. Assisted her to the stretcher, cool cloth applied to forehead, by Leandro ReasonerMichelle Savage, RN and Alferd ApaBridget Faries, RN Placed o2 on at 2 liters sat 100% even before o2 was applied.  BP 68/28 heart rate in the 70's to 80's. Pt was responsive for a few seconds, but then started to talk.  Placed her in [place] position. Dr.   Sampson GoonFitzgerald was informed.

## 2015-07-03 NOTE — Anesthesia Postprocedure Evaluation (Signed)
Anesthesia Post Note  Patient: Jacqueline Soto  Procedure(s) Performed: Procedure(s) (LRB): Open Reduction Internal Fixation Right Ankle (Right)  Patient location during evaluation: PACU Anesthesia Type: General and Regional Level of consciousness: awake and alert Pain management: pain level controlled Vital Signs Assessment: post-procedure vital signs reviewed and stable Respiratory status: spontaneous breathing, nonlabored ventilation and respiratory function stable Cardiovascular status: blood pressure returned to baseline and stable Postop Assessment: no signs of nausea or vomiting Anesthetic complications: no    Last Vitals:  Filed Vitals:   07/03/15 1630 07/03/15 1645  BP: 105/69 106/68  Pulse: 97 87  Temp:    Resp: 24 15    Last Pain: There were no vitals filed for this visit.               Merrilyn Legler,W. EDMOND

## 2015-07-03 NOTE — Op Note (Signed)
07/03/2015  4:08 PM  PATIENT:  Jacqueline Soto Michalec    PRE-OPERATIVE DIAGNOSIS:  Pilon fracture right distal tibia with distal fibular fracture  POST-OPERATIVE DIAGNOSIS:  Same  PROCEDURE:  Open Reduction Internal Fixation right distal tibia articular surface. Open reduction internal fixation right distal fibula.  SURGEON:  Nadara MustardUDA,Kaitlin Ardito V, MD  PHYSICIAN ASSISTANT:None ANESTHESIA:   General  PREOPERATIVE INDICATIONS:  Jacqueline Soto Heckler is a  35 y.o. female with a diagnosis of Trimalleolar Right Ankle Fracture who failed conservative measures and elected for surgical management.    The risks benefits and alternatives were discussed with the patient preoperatively including but not limited to the risks of infection, bleeding, nerve injury, cardiopulmonary complications, the need for revision surgery, among others, and the patient was willing to proceed.  OPERATIVE IMPLANTS: 7-hole one third locking tubular plate laterally and a medial locking plate Synthes  OPERATIVE FINDINGS: Intra-articular distal tibia fracture  OPERATIVE PROCEDURE: Patient was brought to operating and underwent general anesthetic. After adequate levels anesthesia obtained patient's right lower extremity was first cleansed with Hibiclens dried and then prepped using DuraPrep 2 and draped into a sterile field with Ioban used to cover all exposed skin. A timeout was called. A lateral incision was made posterior laterally over the fibula this was carried down sharply to bone. Subperiosteal dissection was used to debride the fracture. The wound was irrigated with normal saline. The fracture was reduced and secured with a 22 mm interfrag screw. A one third tubular's plate was then placed posterior laterally for an anti-glide plate this was locked distally with compression screws proximally. Attention was then focused on the tibia a medial incision was carried down to the periosteum subperiosteal dissection was used to cleanse the fracture  site. The fracture was reduced articular fracture was reduced and this was stabilized with an anti-glide plate medially with compression screws proximally and locking screws distally. C-arm floss be verified alignment. The wounds were irrigated with normal saline. Subcutaneous is closed using 2-0 Vicryl with skin was closed using 2-0 nylon. A sterile compressive dressing was applied patient was placed in fracture boot taken to the PACU in stable condition plan for discharge to home nonweightbearing elevation Percocet for pain felt office in 1-2 weeks

## 2015-07-03 NOTE — Anesthesia Preprocedure Evaluation (Addendum)
Anesthesia Evaluation  Patient identified by MRN, date of birth, ID band Patient awake    Reviewed: Allergy & Precautions, H&P , NPO status , Patient's Chart, lab work & pertinent test results  Airway Mallampati: II  TM Distance: >3 FB Neck ROM: Full    Dental no notable dental hx. (+) Teeth Intact, Dental Advisory Given   Pulmonary asthma , Current Smoker,    Pulmonary exam normal breath sounds clear to auscultation       Cardiovascular negative cardio ROS   Rhythm:Regular Rate:Normal     Neuro/Psych negative neurological ROS  negative psych ROS   GI/Hepatic negative GI ROS, Neg liver ROS,   Endo/Other  negative endocrine ROS  Renal/GU negative Renal ROS  negative genitourinary   Musculoskeletal   Abdominal   Peds  Hematology negative hematology ROS (+)   Anesthesia Other Findings   Reproductive/Obstetrics negative OB ROS                            Anesthesia Physical Anesthesia Plan  ASA: II  Anesthesia Plan: General and Regional   Post-op Pain Management: GA combined w/ Regional for post-op pain   Induction: Intravenous  Airway Management Planned: LMA  Additional Equipment:   Intra-op Plan:   Post-operative Plan: Extubation in OR  Informed Consent: I have reviewed the patients History and Physical, chart, labs and discussed the procedure including the risks, benefits and alternatives for the proposed anesthesia with the patient or authorized representative who has indicated his/her understanding and acceptance.   Dental advisory given  Plan Discussed with: CRNA  Anesthesia Plan Comments:         Anesthesia Quick Evaluation

## 2015-07-06 ENCOUNTER — Encounter (HOSPITAL_COMMUNITY): Payer: Self-pay | Admitting: Orthopedic Surgery

## 2015-07-08 ENCOUNTER — Encounter (HOSPITAL_COMMUNITY): Payer: Self-pay | Admitting: Orthopedic Surgery

## 2016-08-10 IMAGING — CT CT ANKLE*R* W/O CM
4 series · 11 of 20 positions shown, 12 images · non-contrast
Comparison: Radiographs 06/29/2015

CLINICAL DATA: Evaluate right ankle fracture dislocation.

EXAM:
CT OF THE RIGHT ANKLE WITHOUT CONTRAST
TECHNIQUE: Multidetector CT imaging of the right ankle was performed according
to the standard protocol. Multiplanar CT image reconstructions were
also generated.

[Series 5: lower ext soft · axial · 0.34mm/px · z∈[-39,+104]mm · 4 of 95 slices shown, 5 images]
[im 19/95  soft-tissue]
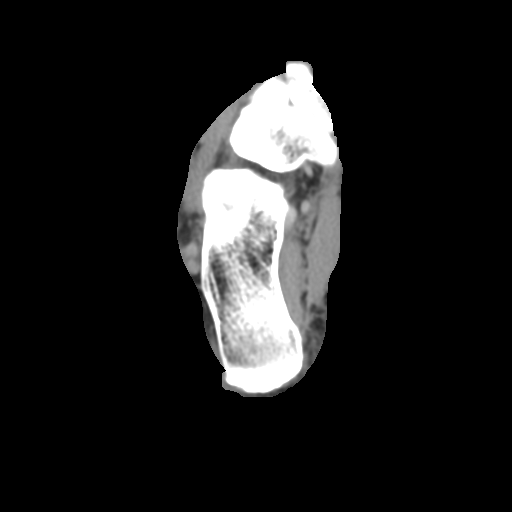
[im 19/95  bone]
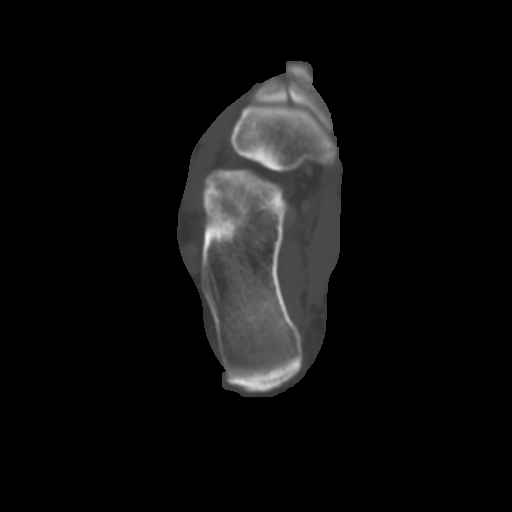
[im 38/95  bone]
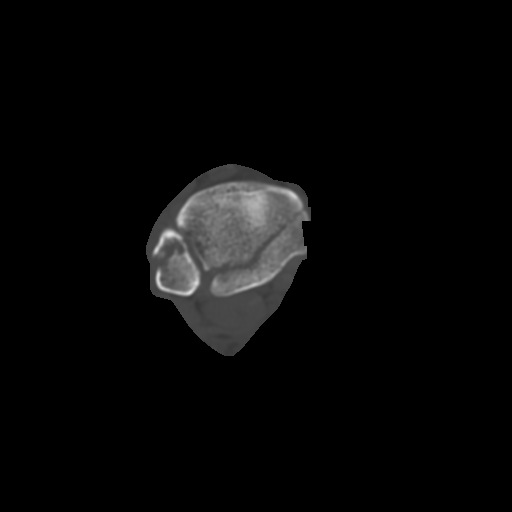
[im 57/95  bone]
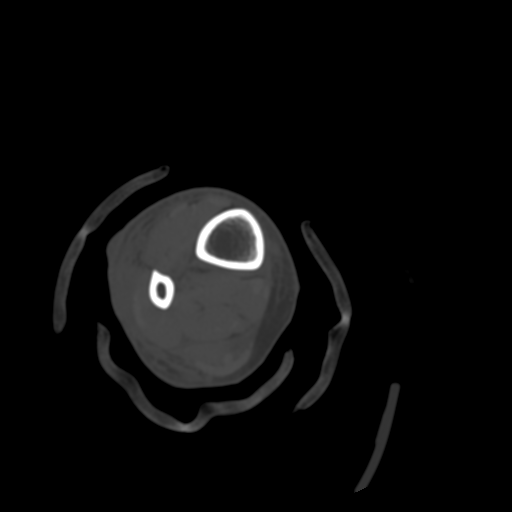
[im 76/95  bone]
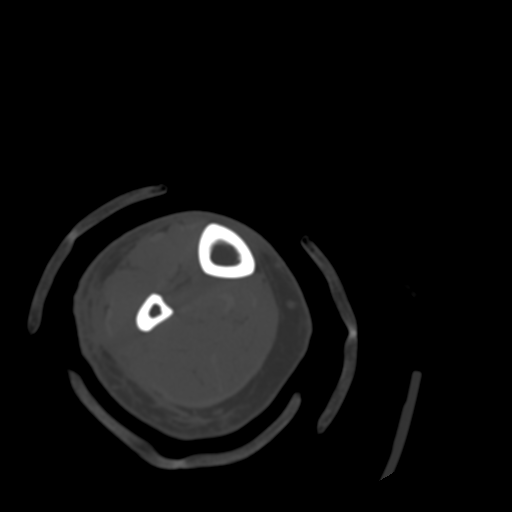

[Series 300: sag soft · sagittal · 0.47mm/px · 2 of 69 slices shown]
[im 23/69  soft-tissue]
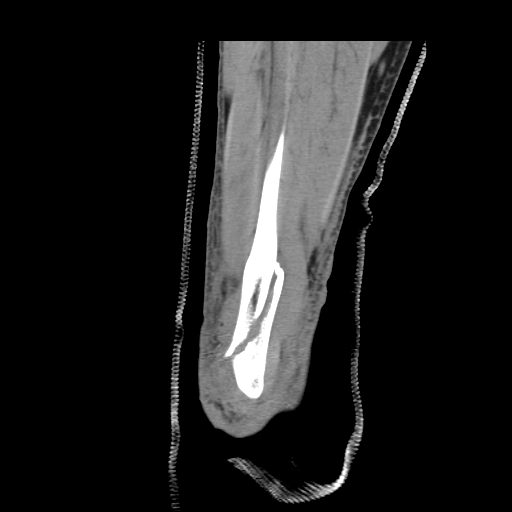
[im 46/69  soft-tissue]
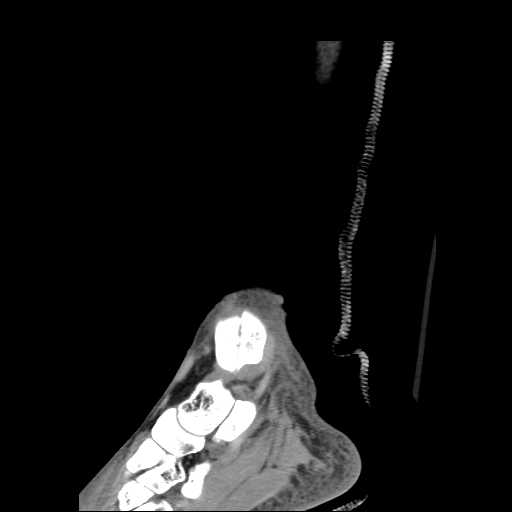

[Series 301: cor soft · coronal · 0.47mm/px · 2 of 69 slices shown (1 of 2)]
[im 23/69  soft-tissue]
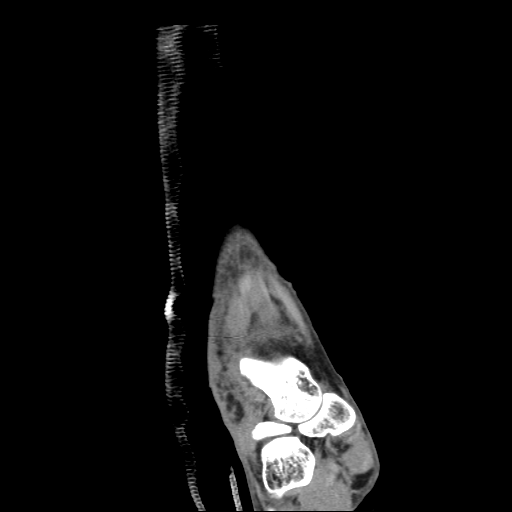
[im 46/69  soft-tissue]
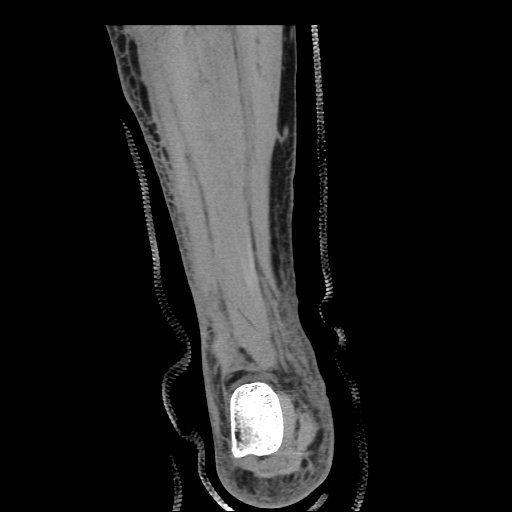

[Series 302: cor soft · coronal · 0.47mm/px · 3 of 69 slices shown (2 of 2)]
[im 14/69  bone]
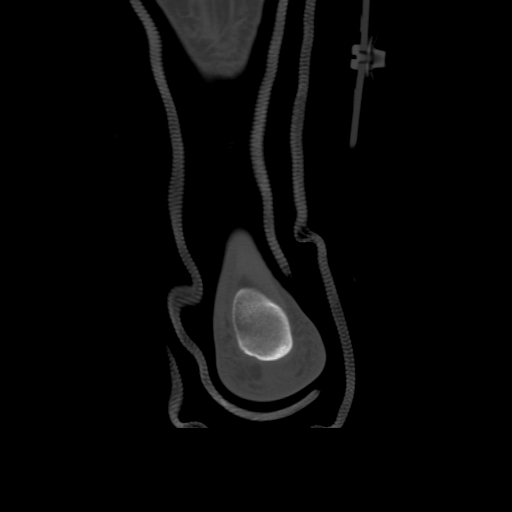
[im 28/69  bone]
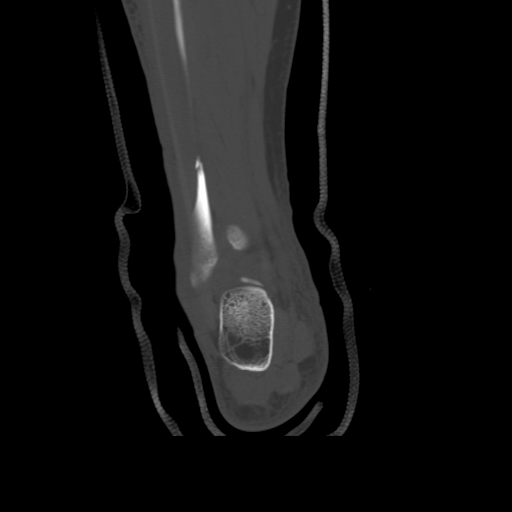
[im 41/69  bone]
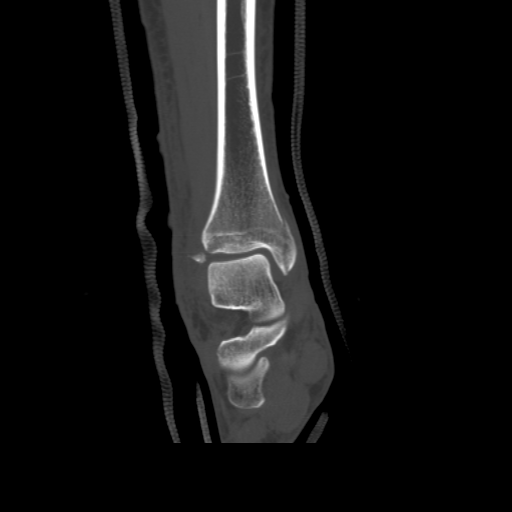

[11 of 20 positions shown; findings below may reference images not displayed]

FINDINGS: Comminuted longitudinal/vertical fracture involving the posterior
and medial aspect of distal tibia. There is a maximum of 3 mm of
offset along the posterior articular surface of the tibia medially.
Maximum separation of the posterior malleolar fracture is 2.5 Mm.
The fracture continues through the medial malleolus which is split
vertically in half.

Long oblique coursing distal fibular shaft fracture at and above the
level of the ankle mortise.

The ankle mortise is maintained. The talus is intact and the
subtalar joints are maintained.
IMPRESSION: 1. Comminuted intra-articular fracture of the distal tibia as
described above. Minimal/mild displacement. Please see 3D images.
2. Long oblique fracture of the distal fibular shaft without
significant displacement.
3. The ankle mortise is grossly maintained and no talar or subtalar
fractures are identified.
